# Patient Record
Sex: Female | Born: 1985 | Race: Asian | Hispanic: No | Marital: Single | Smoking: Never smoker
Health system: Southern US, Community
[De-identification: ages and names within clinical notes are randomized; demographics above are authoritative.]

---

## 2018-11-03 ENCOUNTER — Other Ambulatory Visit (HOSPITAL_BASED_OUTPATIENT_CLINIC_OR_DEPARTMENT_OTHER): Payer: Self-pay | Admitting: Emergency Medicine

## 2018-11-03 ENCOUNTER — Other Ambulatory Visit: Payer: Self-pay

## 2018-11-03 ENCOUNTER — Ambulatory Visit (HOSPITAL_BASED_OUTPATIENT_CLINIC_OR_DEPARTMENT_OTHER)
Admission: RE | Admit: 2018-11-03 | Discharge: 2018-11-03 | Disposition: A | Discharge status: Home / Self Care | Payer: Commercial Managed Care - PPO | Source: Ambulatory Visit | Attending: Emergency Medicine | Admitting: Emergency Medicine

## 2018-11-03 ENCOUNTER — Encounter (HOSPITAL_BASED_OUTPATIENT_CLINIC_OR_DEPARTMENT_OTHER): Payer: Self-pay

## 2018-11-03 DIAGNOSIS — R1031 Right lower quadrant pain: Secondary | ICD-10-CM | POA: Diagnosis present

## 2018-11-03 MED ORDER — IOHEXOL 300 MG/ML  SOLN
100.0000 mL | Freq: Once | INTRAMUSCULAR | Status: AC | PRN
  Administered 2018-11-03: 19:00:00 100 mL via INTRAVENOUS

## 2020-01-09 IMAGING — CT CT ABDOMEN AND PELVIS WITH CONTRAST
2 of 4 series · 16 of 46 positions shown, 18 images · IV contrast (APPLIED)
Comparison: None.

CLINICAL DATA: Right lower quadrant pain

EXAM:
CT ABDOMEN AND PELVIS WITH CONTRAST
TECHNIQUE: Multidetector CT imaging of the abdomen and pelvis was performed
using the standard protocol following bolus administration of
intravenous contrast.
CONTRAST:  100mL OMNIPAQUE IOHEXOL 300 MG/ML  SOLN

[Series 2: axial st · axial · 0.65mm/px · z∈[-586,-151]mm · 13 of 95 slices shown, 15 images]
[im 4/95  soft-tissue]
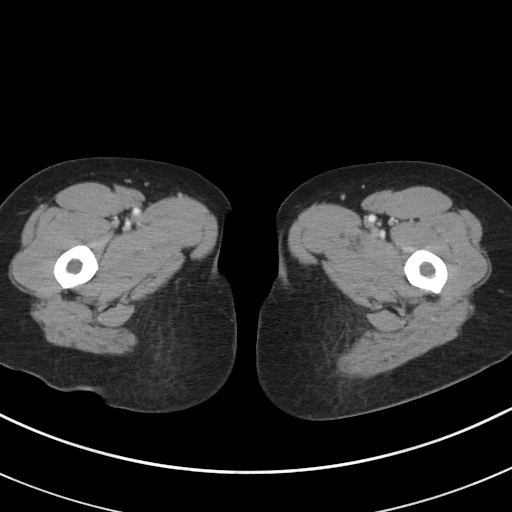
[im 4/95  bone]
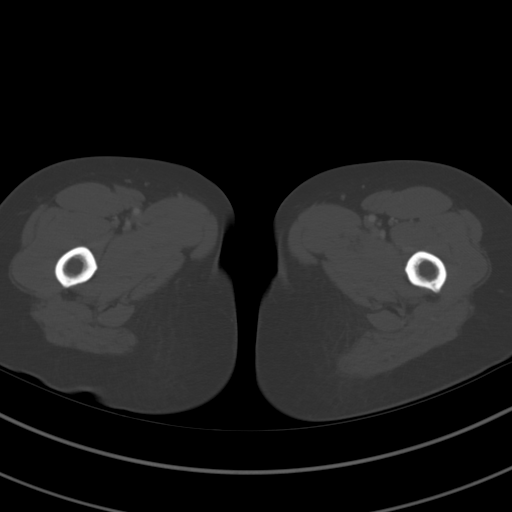
[im 12/95  soft-tissue]
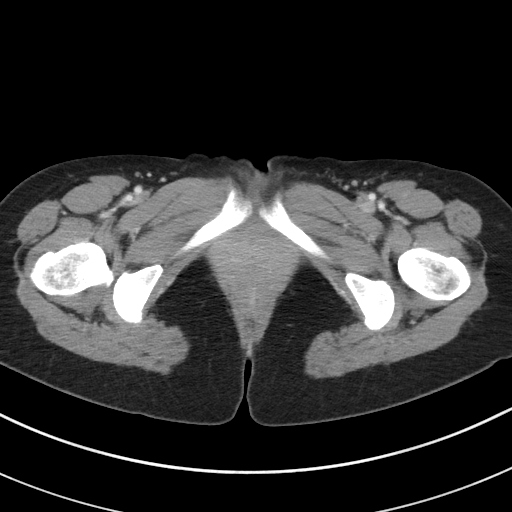
[im 20/95  soft-tissue]
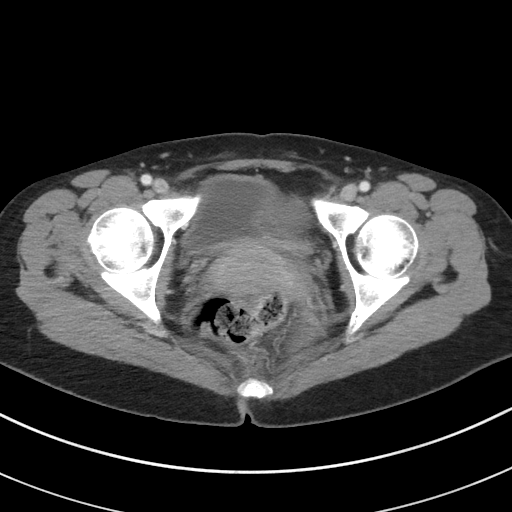
[im 28/95  soft-tissue]
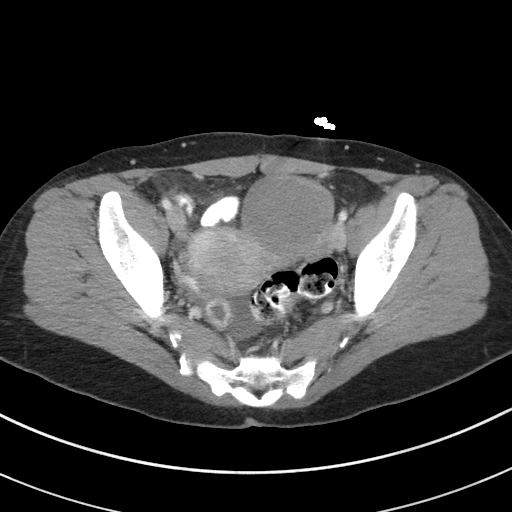
[im 32/95  soft-tissue]
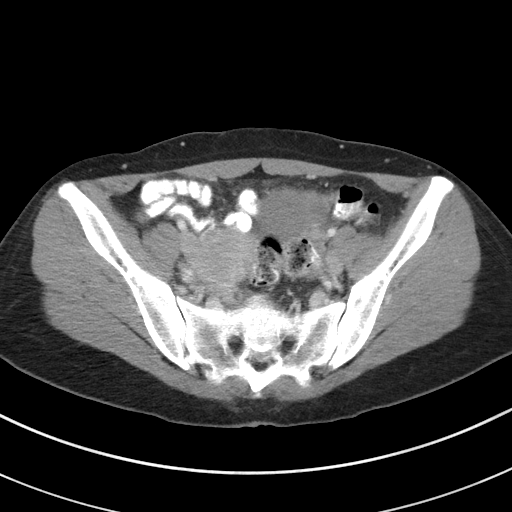
[im 40/95  soft-tissue]
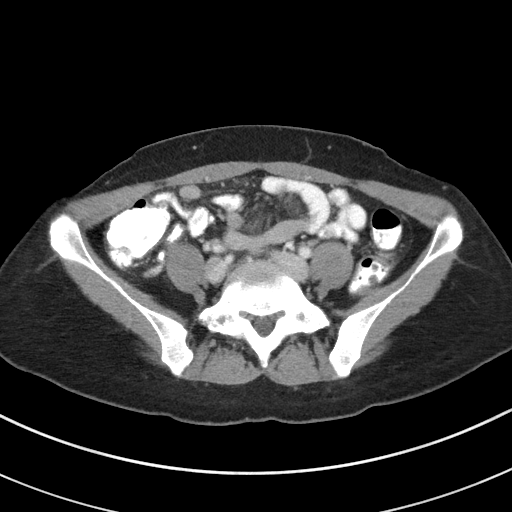
[im 48/95  soft-tissue]
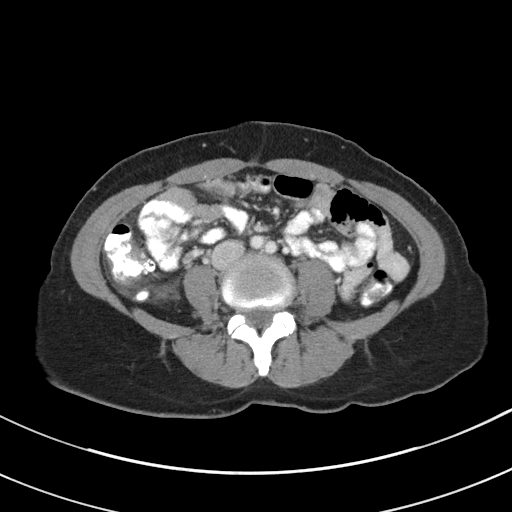
[im 55/95  soft-tissue]
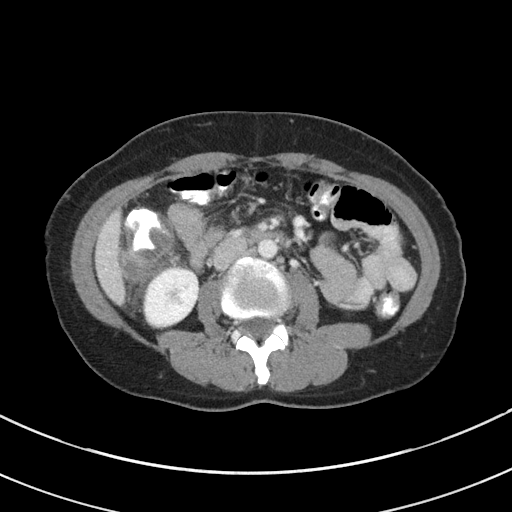
[im 63/95  soft-tissue]
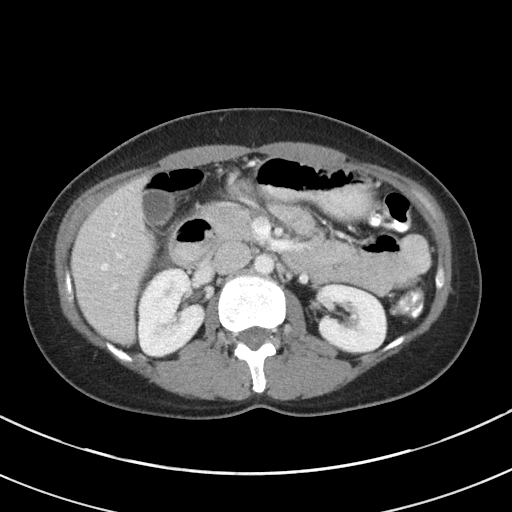
[im 63/95  bone]
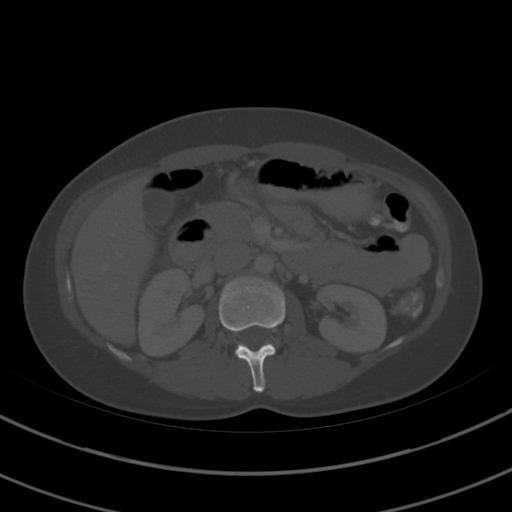
[im 67/95  soft-tissue]
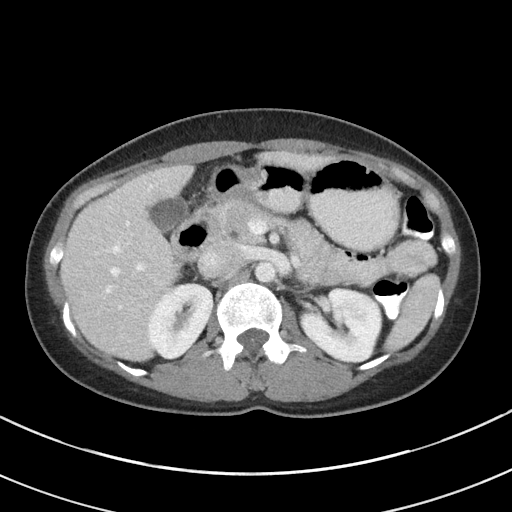
[im 75/95  soft-tissue]
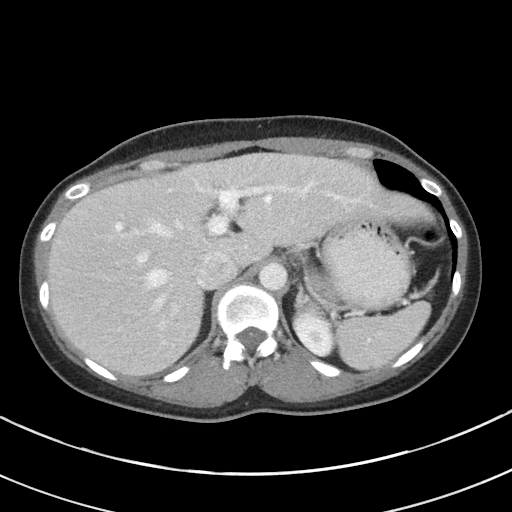
[im 83/95  soft-tissue]
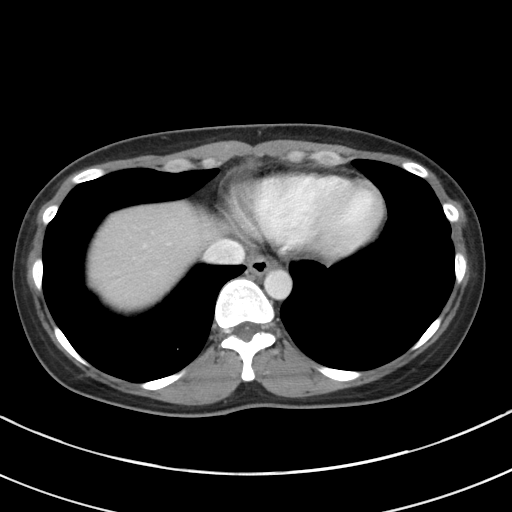
[im 91/95  soft-tissue]
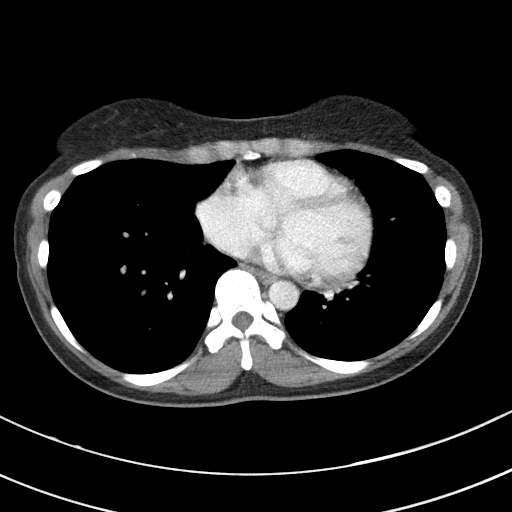

[Series 5: coronal st · coronal · 0.68mm/px · 3 of 79 slices shown]
[im 27/79  soft-tissue]
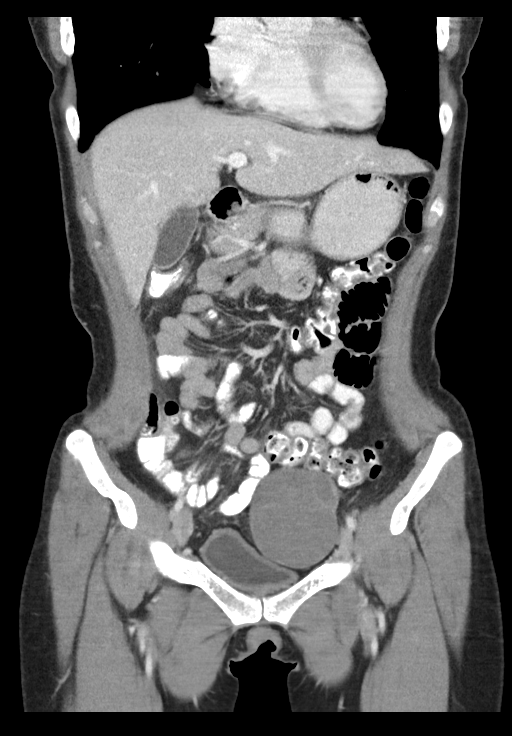
[im 35/79  soft-tissue]
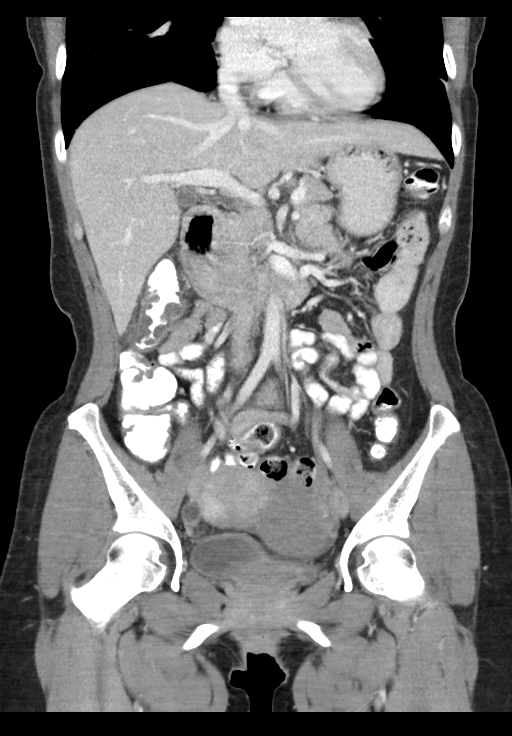
[im 44/79  soft-tissue]
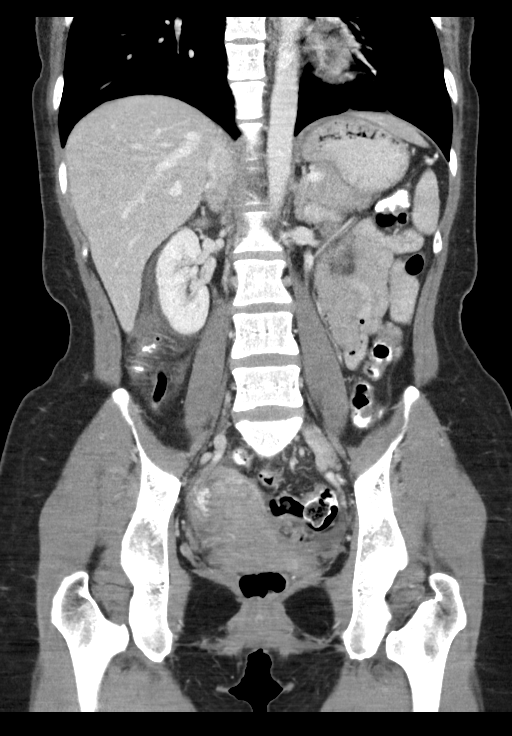

[16 of 46 positions shown; findings below may reference images not displayed]

FINDINGS: Lower chest: Lung bases are clear. No effusions. Heart is normal
size.

Hepatobiliary: No focal hepatic abnormality. Gallbladder
unremarkable.

Pancreas: No focal abnormality or ductal dilatation.

Spleen: No focal abnormality.  Normal size.

Adrenals/Urinary Tract: No adrenal abnormality. No focal renal
abnormality. No stones or hydronephrosis. Urinary bladder is
unremarkable.

Stomach/Bowel: There is circumferential wall thickening and
surrounding inflammation involving the mid ascending colon most
compatible with colitis. There appear to be diverticula in the area
and this could reflect diverticulitis. Appendix is filled with gas
and contrast and is normal. Stomach and small bowel unremarkable.

Vascular/Lymphatic: No evidence of aneurysm or adenopathy.

Reproductive: 6 cm left ovarian cyst. Collapsing follicle or cyst in
the right ovary. Uterus unremarkable.

Other: Small amount of free fluid in the pelvis.  No free air.

Musculoskeletal: No acute bony abnormality.
IMPRESSION: Wall thickening and inflammation involving the mid ascending colon.
This could be related to diverticulitis or infectious/inflammatory
colitis. Appendix is normal.

6 cm left ovarian cyst.

Small amount of free fluid in the pelvis.

## 2022-01-22 ENCOUNTER — Other Ambulatory Visit: Payer: Self-pay

## 2022-01-22 ENCOUNTER — Encounter (HOSPITAL_BASED_OUTPATIENT_CLINIC_OR_DEPARTMENT_OTHER): Payer: Self-pay | Admitting: Emergency Medicine

## 2022-01-22 ENCOUNTER — Emergency Department (HOSPITAL_BASED_OUTPATIENT_CLINIC_OR_DEPARTMENT_OTHER): Payer: Commercial Managed Care - PPO | Admitting: Radiology

## 2022-01-22 ENCOUNTER — Emergency Department (HOSPITAL_BASED_OUTPATIENT_CLINIC_OR_DEPARTMENT_OTHER)
Admission: EM | Admit: 2022-01-22 | Discharge: 2022-01-22 | Disposition: A | Discharge status: Home / Self Care | Payer: Commercial Managed Care - PPO | Attending: Emergency Medicine | Admitting: Emergency Medicine

## 2022-01-22 DIAGNOSIS — Y92410 Unspecified street and highway as the place of occurrence of the external cause: Secondary | ICD-10-CM | POA: Diagnosis not present

## 2022-01-22 DIAGNOSIS — R519 Headache, unspecified: Secondary | ICD-10-CM | POA: Diagnosis not present

## 2022-01-22 DIAGNOSIS — S39012A Strain of muscle, fascia and tendon of lower back, initial encounter: Secondary | ICD-10-CM | POA: Insufficient documentation

## 2022-01-22 DIAGNOSIS — S3992XA Unspecified injury of lower back, initial encounter: Secondary | ICD-10-CM | POA: Diagnosis present

## 2022-01-22 MED ORDER — IBUPROFEN 800 MG PO TABS: 800.0000 mg | ORAL_TABLET | Freq: Three times a day (TID) | ORAL | 0 refills | Status: AC | PRN

## 2022-01-22 NOTE — ED Notes (Signed)
Pt awake and alert - oob ambulating to and from hall bathroom with steady gait; no distress noted. (Late entry)

## 2022-01-22 NOTE — ED Notes (Signed)
Per PA-C pt refusing to remove C-Collar however while this nurse at bedside pt removed c-collar on her own -- pt understands and agrees with d/c plan - this nurse has verbally reviewed d/c instructions and provided pt with written copy- pt acknowledges verbal understanding and denies any addl questions concerns needs - pt ok to ambulate at d/c; family member will be driving pt home
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
## 2022-01-22 NOTE — Discharge Instructions (Addendum)
Return if any problems.

## 2022-01-22 NOTE — ED Notes (Signed)
ED Provider at bedside. 
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
